# Patient Record
Sex: Male | Born: 1980 | Race: White | Hispanic: No | Marital: Married | Smoking: Never smoker
Health system: Southern US, Community
[De-identification: ages and names within clinical notes are randomized; demographics above are authoritative.]

## PROBLEM LIST (undated history)

## (undated) DIAGNOSIS — R569 Unspecified convulsions: Secondary | ICD-10-CM

---

## 2009-06-04 ENCOUNTER — Emergency Department (HOSPITAL_COMMUNITY): Admission: EM | Admit: 2009-06-04 | Discharge: 2009-06-04 | Payer: Self-pay | Admitting: Emergency Medicine

## 2009-06-09 ENCOUNTER — Ambulatory Visit (HOSPITAL_COMMUNITY): Admission: RE | Admit: 2009-06-09 | Discharge: 2009-06-09 | Payer: Self-pay | Admitting: Neurology

## 2009-07-21 ENCOUNTER — Encounter: Admission: RE | Admit: 2009-07-21 | Discharge: 2009-07-21 | Payer: Self-pay | Admitting: Neurology

## 2009-10-28 ENCOUNTER — Emergency Department (HOSPITAL_COMMUNITY): Admission: EM | Admit: 2009-10-28 | Discharge: 2009-10-28 | Payer: Self-pay | Admitting: Emergency Medicine

## 2010-09-07 LAB — BASIC METABOLIC PANEL
BUN: 12 mg/dL (ref 6–23)
BUN: 13 mg/dL (ref 6–23)
CO2: 11 mEq/L — ABNORMAL LOW (ref 19–32)
CO2: 20 mEq/L (ref 19–32)
GFR calc non Af Amer: 56 mL/min — ABNORMAL LOW (ref >60)
GFR calc non Af Amer: >60 mL/min (ref >60)
Glucose, Bld: 303 mg/dL — ABNORMAL HIGH (ref 70–99)
Potassium: 4.3 mEq/L (ref 3.5–5.1)

## 2010-09-07 LAB — DIFFERENTIAL
Basophils Absolute: 0 10*3/uL (ref 0.0–0.1)
Basophils Relative: 0 % (ref 0–1)
Monocytes Absolute: 0.9 10*3/uL (ref 0.1–1.0)
Monocytes Relative: 6 % (ref 3–12)
Neutro Abs: 5.9 10*3/uL (ref 1.7–7.7)

## 2010-09-07 LAB — GLUCOSE, CAPILLARY: Glucose-Capillary: 292 mg/dL — ABNORMAL HIGH (ref 70–99)

## 2010-09-07 LAB — CBC
MCV: 92.2 fL (ref 78.0–100.0)
RBC: 5.08 MIL/uL (ref 4.22–5.81)
WBC: 15.2 10*3/uL — ABNORMAL HIGH (ref 4.0–10.5)

## 2010-09-07 LAB — PATHOLOGIST SMEAR REVIEW

## 2010-09-20 LAB — URINALYSIS, ROUTINE W REFLEX MICROSCOPIC
Leukocytes, UA: NEGATIVE
Protein, ur: 30 mg/dL — AB
Specific Gravity, Urine: 1.017 (ref 1.005–1.030)
pH: 6 (ref 5.0–8.0)

## 2010-09-20 LAB — DIFFERENTIAL
Lymphs Abs: 1.3 10*3/uL (ref 0.7–4.0)
Neutro Abs: 7.9 10*3/uL — ABNORMAL HIGH (ref 1.7–7.7)
Neutrophils Relative %: 82 % — ABNORMAL HIGH (ref 43–77)

## 2010-09-20 LAB — URINE MICROSCOPIC-ADD ON

## 2010-09-20 LAB — CBC
HCT: 46.9 % (ref 39.0–52.0)
Platelets: 153 10*3/uL (ref 150–400)
RDW: 12.5 % (ref 11.5–15.5)
WBC: 9.6 10*3/uL (ref 4.0–10.5)

## 2010-09-20 LAB — RAPID URINE DRUG SCREEN, HOSP PERFORMED
Amphetamines: NOT DETECTED
Opiates: NOT DETECTED

## 2010-09-20 LAB — POCT I-STAT, CHEM 8
BUN: 17 mg/dL (ref 6–23)
Calcium, Ion: 1.22 mmol/L (ref 1.12–1.32)
Glucose, Bld: 203 mg/dL — ABNORMAL HIGH (ref 70–99)
Sodium: 135 mEq/L (ref 135–145)
TCO2: 24 mmol/L (ref 0–100)

## 2012-06-04 ENCOUNTER — Other Ambulatory Visit: Payer: Self-pay | Admitting: Physician Assistant

## 2012-06-04 ENCOUNTER — Ambulatory Visit
Admission: RE | Admit: 2012-06-04 | Discharge: 2012-06-04 | Disposition: A | Discharge status: Home / Self Care | Payer: Managed Care, Other (non HMO) | Source: Ambulatory Visit | Attending: Physician Assistant | Admitting: Physician Assistant

## 2012-06-04 DIAGNOSIS — R05 Cough: Secondary | ICD-10-CM

## 2013-03-30 ENCOUNTER — Emergency Department (HOSPITAL_COMMUNITY): Payer: Worker's Compensation

## 2013-03-30 ENCOUNTER — Emergency Department (HOSPITAL_COMMUNITY)
Admission: EM | Admit: 2013-03-30 | Discharge: 2013-03-31 | Disposition: A | Discharge status: Home / Self Care | Payer: Worker's Compensation | Attending: Emergency Medicine | Admitting: Emergency Medicine

## 2013-03-30 ENCOUNTER — Encounter (HOSPITAL_COMMUNITY): Payer: Self-pay | Admitting: Emergency Medicine

## 2013-03-30 DIAGNOSIS — S40029A Contusion of unspecified upper arm, initial encounter: Secondary | ICD-10-CM | POA: Insufficient documentation

## 2013-03-30 DIAGNOSIS — T07XXXA Unspecified multiple injuries, initial encounter: Secondary | ICD-10-CM

## 2013-03-30 DIAGNOSIS — Y9389 Activity, other specified: Secondary | ICD-10-CM | POA: Insufficient documentation

## 2013-03-30 DIAGNOSIS — IMO0002 Reserved for concepts with insufficient information to code with codable children: Secondary | ICD-10-CM | POA: Insufficient documentation

## 2013-03-30 DIAGNOSIS — S40811A Abrasion of right upper arm, initial encounter: Secondary | ICD-10-CM

## 2013-03-30 DIAGNOSIS — R4182 Altered mental status, unspecified: Secondary | ICD-10-CM | POA: Insufficient documentation

## 2013-03-30 DIAGNOSIS — Y92410 Unspecified street and highway as the place of occurrence of the external cause: Secondary | ICD-10-CM | POA: Insufficient documentation

## 2013-03-30 DIAGNOSIS — S3992XA Unspecified injury of lower back, initial encounter: Secondary | ICD-10-CM

## 2013-03-30 DIAGNOSIS — S199XXA Unspecified injury of neck, initial encounter: Secondary | ICD-10-CM

## 2013-03-30 DIAGNOSIS — S0990XA Unspecified injury of head, initial encounter: Secondary | ICD-10-CM | POA: Insufficient documentation

## 2013-03-30 DIAGNOSIS — G40909 Epilepsy, unspecified, not intractable, without status epilepticus: Secondary | ICD-10-CM | POA: Insufficient documentation

## 2013-03-30 DIAGNOSIS — Z79899 Other long term (current) drug therapy: Secondary | ICD-10-CM | POA: Insufficient documentation

## 2013-03-30 DIAGNOSIS — S5010XA Contusion of unspecified forearm, initial encounter: Secondary | ICD-10-CM | POA: Insufficient documentation

## 2013-03-30 DIAGNOSIS — S0993XA Unspecified injury of face, initial encounter: Secondary | ICD-10-CM | POA: Insufficient documentation

## 2013-03-30 HISTORY — DX: Unspecified convulsions: R56.9

## 2013-03-30 LAB — COMPREHENSIVE METABOLIC PANEL
AST: 129 U/L — ABNORMAL HIGH (ref 0–37)
Albumin: 3.6 g/dL (ref 3.5–5.2)
BUN: 14 mg/dL (ref 6–23)
CO2: 24 mEq/L (ref 19–32)
Calcium: 8.6 mg/dL (ref 8.4–10.5)
Creatinine, Ser: 0.92 mg/dL (ref 0.50–1.35)
GFR calc Af Amer: >90 mL/min (ref >90)
GFR calc non Af Amer: >90 mL/min (ref >90)

## 2013-03-30 LAB — SAMPLE TO BLOOD BANK

## 2013-03-30 LAB — CG4 I-STAT (LACTIC ACID): Lactic Acid, Venous: 1.54 mmol/L (ref 0.5–2.2)

## 2013-03-30 LAB — CBC
HCT: 38.2 % — ABNORMAL LOW (ref 39.0–52.0)
Hemoglobin: 13.9 g/dL (ref 13.0–17.0)
MCH: 31.7 pg (ref 26.0–34.0)
RBC: 4.38 MIL/uL (ref 4.22–5.81)
WBC: 9.1 10*3/uL (ref 4.0–10.5)

## 2013-03-30 LAB — POCT I-STAT, CHEM 8
Calcium, Ion: 1.15 mmol/L (ref 1.12–1.23)
HCT: 40 % (ref 39.0–52.0)
Hemoglobin: 13.6 g/dL (ref 13.0–17.0)
Potassium: 3.8 mEq/L (ref 3.5–5.1)
Sodium: 138 mEq/L (ref 135–145)
TCO2: 25 mmol/L (ref 0–100)

## 2013-03-30 LAB — PROTIME-INR
INR: 0.98 (ref 0.00–1.49)
Prothrombin Time: 12.8 seconds (ref 11.6–15.2)

## 2013-03-30 MED ORDER — HYDROMORPHONE HCL PF 1 MG/ML IJ SOLN
1.0000 mg | Freq: Once | INTRAMUSCULAR | Status: AC
  Administered 2013-03-30: 1 mg via INTRAVENOUS
  Filled 2013-03-30: qty 1

## 2013-03-30 MED ORDER — SODIUM CHLORIDE 0.9 % IV SOLN
1000.0000 mL | Freq: Once | INTRAVENOUS | Status: AC
  Administered 2013-03-30: 1000 mL via INTRAVENOUS

## 2013-03-30 MED ORDER — OXYCODONE-ACETAMINOPHEN 5-325 MG PO TABS: 1.0000 | ORAL_TABLET | ORAL | Status: AC | PRN

## 2013-03-30 MED ORDER — OXYCODONE-ACETAMINOPHEN 5-325 MG PO TABS
2.0000 | ORAL_TABLET | Freq: Once | ORAL | Status: AC
  Administered 2013-03-31: 2 via ORAL
  Filled 2013-03-30: qty 2

## 2013-03-30 MED ORDER — TETANUS-DIPHTHERIA TOXOIDS TD 5-2 LFU IM INJ
0.5000 mL | INJECTION | Freq: Once | INTRAMUSCULAR | Status: DC
  Filled 2013-03-30: qty 0.5

## 2013-03-30 MED ORDER — IOHEXOL 300 MG/ML  SOLN
100.0000 mL | Freq: Once | INTRAMUSCULAR | Status: AC | PRN
  Administered 2013-03-30: 100 mL via INTRAVENOUS

## 2013-03-30 MED ORDER — SODIUM CHLORIDE 0.9 % IV SOLN
1000.0000 mL | INTRAVENOUS | Status: DC
  Administered 2013-03-30: 1000 mL via INTRAVENOUS
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
## 2013-03-30 NOTE — ED Notes (Signed)
Pt requesting medication for pain.  EDP made aware.

## 2013-03-30 NOTE — ED Notes (Signed)
[employer] in Ct Scan with pt.
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
## 2013-04-24 ENCOUNTER — Other Ambulatory Visit: Payer: Self-pay | Admitting: Physician Assistant

## 2013-04-24 ENCOUNTER — Ambulatory Visit
Admission: RE | Admit: 2013-04-24 | Discharge: 2013-04-24 | Disposition: A | Discharge status: Home / Self Care | Payer: Managed Care, Other (non HMO) | Source: Ambulatory Visit | Attending: Physician Assistant | Admitting: Physician Assistant

## 2013-04-30 ENCOUNTER — Other Ambulatory Visit: Payer: Self-pay | Admitting: Orthopedic Surgery

## 2013-04-30 DIAGNOSIS — M25511 Pain in right shoulder: Secondary | ICD-10-CM

## 2013-05-10 ENCOUNTER — Ambulatory Visit
Admission: RE | Admit: 2013-05-10 | Discharge: 2013-05-10 | Disposition: A | Discharge status: Home / Self Care | Payer: Managed Care, Other (non HMO) | Source: Ambulatory Visit | Attending: Orthopedic Surgery | Admitting: Orthopedic Surgery

## 2013-05-10 ENCOUNTER — Other Ambulatory Visit: Payer: Self-pay

## 2013-05-10 DIAGNOSIS — M25511 Pain in right shoulder: Secondary | ICD-10-CM

## 2013-05-10 MED ORDER — IOHEXOL 180 MG/ML  SOLN: 15.0000 mL | Freq: Once | INTRAMUSCULAR | Status: AC | PRN

## 2013-05-13 ENCOUNTER — Other Ambulatory Visit: Payer: Self-pay | Admitting: Orthopedic Surgery

## 2013-05-13 DIAGNOSIS — M542 Cervicalgia: Secondary | ICD-10-CM

## 2013-05-28 ENCOUNTER — Other Ambulatory Visit: Payer: Managed Care, Other (non HMO)

## 2013-05-30 ENCOUNTER — Ambulatory Visit
Admission: RE | Admit: 2013-05-30 | Discharge: 2013-05-30 | Disposition: A | Discharge status: Home / Self Care | Payer: Managed Care, Other (non HMO) | Source: Ambulatory Visit | Attending: Orthopedic Surgery | Admitting: Orthopedic Surgery

## 2013-05-30 DIAGNOSIS — M542 Cervicalgia: Secondary | ICD-10-CM

## 2013-08-08 ENCOUNTER — Ambulatory Visit
Admission: RE | Admit: 2013-08-08 | Discharge: 2013-08-08 | Disposition: A | Discharge status: Home / Self Care | Payer: Managed Care, Other (non HMO) | Source: Ambulatory Visit | Attending: Physician Assistant | Admitting: Physician Assistant

## 2013-08-08 ENCOUNTER — Other Ambulatory Visit: Payer: Self-pay | Admitting: Physician Assistant

## 2013-08-08 DIAGNOSIS — R202 Paresthesia of skin: Principal | ICD-10-CM

## 2013-08-08 DIAGNOSIS — R200 Anesthesia of skin: Secondary | ICD-10-CM

## 2014-02-13 ENCOUNTER — Ambulatory Visit
Admission: RE | Admit: 2014-02-13 | Discharge: 2014-02-13 | Disposition: A | Discharge status: Home / Self Care | Payer: Managed Care, Other (non HMO) | Source: Ambulatory Visit | Attending: Physician Assistant | Admitting: Physician Assistant

## 2014-02-13 ENCOUNTER — Other Ambulatory Visit: Payer: Self-pay | Admitting: Physician Assistant

## 2014-02-13 DIAGNOSIS — R05 Cough: Secondary | ICD-10-CM

## 2014-02-13 DIAGNOSIS — R059 Cough, unspecified: Secondary | ICD-10-CM

## 2014-02-14 ENCOUNTER — Other Ambulatory Visit: Payer: Self-pay | Admitting: Physician Assistant

## 2014-02-14 DIAGNOSIS — R7989 Other specified abnormal findings of blood chemistry: Secondary | ICD-10-CM

## 2014-02-14 DIAGNOSIS — R945 Abnormal results of liver function studies: Principal | ICD-10-CM

## 2014-02-21 ENCOUNTER — Other Ambulatory Visit: Payer: Managed Care, Other (non HMO)

## 2014-02-27 ENCOUNTER — Ambulatory Visit: Payer: Managed Care, Other (non HMO) | Admitting: Internal Medicine

## 2014-03-13 ENCOUNTER — Other Ambulatory Visit: Payer: Self-pay | Admitting: Gastroenterology

## 2014-03-13 DIAGNOSIS — R945 Abnormal results of liver function studies: Principal | ICD-10-CM

## 2014-03-13 DIAGNOSIS — R7989 Other specified abnormal findings of blood chemistry: Secondary | ICD-10-CM

## 2014-03-20 ENCOUNTER — Encounter: Payer: Self-pay | Admitting: Internal Medicine

## 2014-03-27 ENCOUNTER — Other Ambulatory Visit: Payer: Managed Care, Other (non HMO)

## 2014-10-19 DEATH — deceased
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
